# Patient Record
Sex: Male | Born: 1980 | Race: White | Hispanic: No | Marital: Single | Smoking: Current every day smoker
Health system: Southern US, Community
[De-identification: ages and names within clinical notes are randomized; demographics above are authoritative.]

---

## 2013-08-28 ENCOUNTER — Encounter (HOSPITAL_COMMUNITY): Payer: Self-pay | Admitting: Emergency Medicine

## 2013-08-28 ENCOUNTER — Emergency Department (INDEPENDENT_AMBULATORY_CARE_PROVIDER_SITE_OTHER)
Admission: EM | Admit: 2013-08-28 | Discharge: 2013-08-28 | Disposition: A | Discharge status: Home / Self Care | Payer: BC Managed Care – PPO | Source: Home / Self Care | Attending: Emergency Medicine | Admitting: Emergency Medicine

## 2013-08-28 DIAGNOSIS — IMO0002 Reserved for concepts with insufficient information to code with codable children: Secondary | ICD-10-CM

## 2013-08-28 DIAGNOSIS — M5416 Radiculopathy, lumbar region: Secondary | ICD-10-CM

## 2013-08-28 MED ORDER — CYCLOBENZAPRINE HCL 5 MG PO TABS: 5.0000 mg | ORAL_TABLET | Freq: Three times a day (TID) | ORAL | Status: AC | PRN

## 2013-08-28 MED ORDER — KETOROLAC TROMETHAMINE 60 MG/2ML IM SOLN
60.0000 mg | Freq: Once | INTRAMUSCULAR | Status: AC
  Administered 2013-08-28: 60 mg via INTRAMUSCULAR

## 2013-08-28 MED ORDER — METHYLPREDNISOLONE ACETATE 80 MG/ML IJ SUSP
INTRAMUSCULAR | Status: AC
  Filled 2013-08-28: qty 1

## 2013-08-28 MED ORDER — DICLOFENAC SODIUM 75 MG PO TBEC: 75.0000 mg | DELAYED_RELEASE_TABLET | Freq: Two times a day (BID) | ORAL | Status: AC

## 2013-08-28 MED ORDER — METHYLPREDNISOLONE ACETATE 80 MG/ML IJ SUSP
80.0000 mg | Freq: Once | INTRAMUSCULAR | Status: AC
  Administered 2013-08-28: 80 mg via INTRAMUSCULAR

## 2013-08-28 MED ORDER — KETOROLAC TROMETHAMINE 60 MG/2ML IM SOLN
INTRAMUSCULAR | Status: AC
  Filled 2013-08-28: qty 2

## 2013-08-28 MED ORDER — OXYCODONE-ACETAMINOPHEN 5-325 MG PO TABS: ORAL_TABLET | ORAL | Status: AC

## 2013-08-28 NOTE — ED Notes (Signed)
C/o  Lower back pain.  States pulled back while moving furniture on Friday.  Denies any urinary symptoms.  Hx back injury two years ago.  Pt has tried heating pad and otc meds with no relief.

## 2013-08-28 NOTE — ED Provider Notes (Signed)
Chief Complaint   Chief Complaint  Patient presents with  . Back Pain    History of Present Illness   Curtis Ortega is a 33 year old male who moves or so for this past Friday, 5 days ago. Since then he's had pain in his lower back with radiation into the backs of both legs as far as the knees. There is numbness in both legs. It's better if he lies flat on his back or if he applies heat and worse if he walks, stands, or twists. He has a history of a back problem several years ago which was treated with steroid injections and got better after that. This was [place] in [place]. He denies any bladder or bowel dysfunction, extremity weakness, fever, chills, or unintended weight loss.  Review of Systems   Other than as noted above, the patient denies any of the following symptoms: Systemic:  No fever, chills, or unexplained weight loss. GI:  No abdominal painor incontinence of bowel. GU:  No dysuria, frequency, urgency, or hematuria. No incontinence of urine or urinary retention.  M-S:  No neck pain or arthritis. Neuro:  No paresthesias, headache, saddle anesthesia, muscular weakness, or progressive neurological deficit.  PMFSH   Past medical history, family history, social history, meds, and allergies were reviewed. Specifically, there is no history of cancer, major trauma, osteoporosis, immunosuppression, or HIV infection.   Physical Examination    Vital signs:  BP 113/67  Pulse 75  Temp(Src) 98.4 F (36.9 C) (Oral)  Resp 16  SpO2 100% General:  Alert, oriented, in no distress. Abdomen:  Soft, non-tender.  No organomegaly or mass.  No pulsatile midline abdominal mass or bruit. Back:  There is pain to palpation lower lumbar spine but not over the sacroiliac joints. His back had a limited range of motion with 30 of flexion, 10 of extension, 10 of lateral bending, and 30 of rotation with pain. Straight leg raising was markedly positive on the left and newly positive on the right. He has  a positive Lasegue's sign and positive popliteal compression bilaterally. Neuro:  Normal muscle strength, sensations and DTRs. Extremities: Pedal pulses were full, there was no edema. Skin:  Clear, warm and dry.  No rash.  [facility]   Given Depo-Medrol 80 mg IM and Toradol 60 mg IM.    Assessment   The encounter diagnosis was Lumbar radiculopathy.  Plan     1.  Meds:  The following meds were prescribed:   Discharge Medication List as of 08/28/2013 11:09 AM    START taking these medications   Details  cyclobenzaprine (FLEXERIL) 5 MG tablet Take 1 tablet (5 mg total) by mouth 3 (three) times daily as needed for muscle spasms., Starting 08/28/2013, Until Discontinued, Normal    diclofenac (VOLTAREN) 75 MG EC tablet Take 1 tablet (75 mg total) by mouth 2 (two) times daily., Starting 08/28/2013, Until Discontinued, Normal    oxyCODONE-acetaminophen (PERCOCET) 5-325 MG per tablet 1 to 2 tablets every 6 hours as needed for pain., Print        2.  Patient Education/Counseling:  The patient was given appropriate handouts, self care instructions, and instructed in symptomatic relief. The patient was encouraged to try to be as active as possible and given some exercises to do followed by moist heat.  3.  Follow up:  The patient was told to follow up here if no better in 3 to 4 days, or sooner if becoming worse in any way, and given  some red flag symptoms such as worsening pain or new neurological symptoms which would prompt immediate return.  Follow up with Dr. Renaye Rakers if no better in 2-4 weeks.     Reuben Likes, MD 08/28/13 [phone]

## 2013-08-28 NOTE — Discharge Instructions (Signed)
Do exercises twice daily followed by moist heat for 15 minutes. ° ° ° ° ° °Try to be as active as possible. ° °If no better in 2 weeks, follow up with orthopedist. ° ° °

## 2013-12-17 ENCOUNTER — Emergency Department (INDEPENDENT_AMBULATORY_CARE_PROVIDER_SITE_OTHER): Payer: BC Managed Care – PPO

## 2013-12-17 ENCOUNTER — Emergency Department (HOSPITAL_COMMUNITY)
Admission: EM | Admit: 2013-12-17 | Discharge: 2013-12-17 | Disposition: A | Discharge status: Home / Self Care | Payer: BC Managed Care – PPO | Source: Home / Self Care | Attending: Family Medicine | Admitting: Family Medicine

## 2013-12-17 ENCOUNTER — Encounter (HOSPITAL_COMMUNITY): Payer: Self-pay | Admitting: Emergency Medicine

## 2013-12-17 DIAGNOSIS — IMO0002 Reserved for concepts with insufficient information to code with codable children: Secondary | ICD-10-CM

## 2013-12-17 DIAGNOSIS — S9032XA Contusion of left foot, initial encounter: Secondary | ICD-10-CM

## 2013-12-17 DIAGNOSIS — S9030XA Contusion of unspecified foot, initial encounter: Secondary | ICD-10-CM

## 2013-12-17 MED ORDER — IBUPROFEN 800 MG PO TABS
800.0000 mg | ORAL_TABLET | Freq: Once | ORAL | Status: AC
  Administered 2013-12-17: 800 mg via ORAL

## 2013-12-17 MED ORDER — IBUPROFEN 800 MG PO TABS
ORAL_TABLET | ORAL | Status: AC
  Filled 2013-12-17: qty 1

## 2013-12-17 NOTE — ED Provider Notes (Signed)
Medical screening examination/treatment/procedure(s) were performed by resident physician or non-physician practitioner and as supervising physician I was immediately available for consultation/collaboration.   KINDL,JAMES DOUGLAS MD.   James D Kindl, MD 12/17/13 2121 

## 2013-12-17 NOTE — ED Provider Notes (Signed)
CSN: [id number]     Arrival date & time 12/17/13  1526 History   First MD Initiated Contact with Patient 12/17/13 1631     Chief Complaint  Patient presents with  . Foot Injury   (Consider location/radiation/quality/duration/timing/severity/associated sxs/prior Treatment) Patient is a 33 y.o. male presenting with foot injury. The history is provided by the patient.  Foot Injury Location:  Foot Time since incident:  4 days Injury: yes   Mechanism of injury comment:  Works [employer] and accidentally dropped a bedrail on his foot on 12-14-2013 Foot location:  L foot Chronicity:  New Dislocation: no     History reviewed. No pertinent past medical history. History reviewed. No pertinent past surgical history. History reviewed. No pertinent family history. History  Substance Use Topics  . Smoking status: Current Every Day Smoker -- 0.50 packs/day    Types: Cigarettes  . Smokeless tobacco: Not on file  . Alcohol Use: Yes    Review of Systems  All other systems reviewed and are negative.   Allergies  Review of patient's allergies indicates no known allergies.  Home Medications   Prior to Admission medications   Medication Sig Start Date End Date Taking? Authorizing Provider  cyclobenzaprine (FLEXERIL) 5 MG tablet Take 1 tablet (5 mg total) by mouth 3 (three) times daily as needed for muscle spasms. 08/28/13   Reuben Likesavid C Keller, MD  diclofenac (VOLTAREN) 75 MG EC tablet Take 1 tablet (75 mg total) by mouth 2 (two) times daily. 08/28/13   Reuben Likesavid C Keller, MD  oxyCODONE-acetaminophen (PERCOCET) 5-325 MG per tablet 1 to 2 tablets every 6 hours as needed for pain. 08/28/13   Reuben Likesavid C Keller, MD  Sertraline HCl (ZOLOFT PO) Take by mouth.    Historical Provider, MD   BP 130/86  Pulse 80  Temp(Src) 98.8 F (37.1 C) (Oral)  Resp 14  SpO2 100% Physical Exam  Nursing note and vitals reviewed. Constitutional: He is oriented to person, place, and time. He appears  well-developed and well-nourished. No distress.  HENT:  Head: Normocephalic and atraumatic.  Eyes: Conjunctivae are normal. No scleral icterus.  Cardiovascular: Normal rate.   Pulmonary/Chest: Effort normal.  Musculoskeletal:       Left foot: He exhibits tenderness and swelling. He exhibits normal range of motion, normal capillary refill, no crepitus, no deformity and no laceration.       Feet:  +CSM exam of left normal.  Neurological: He is alert and oriented to person, place, and time.  Skin: Skin is warm and dry.  +intact  Psychiatric: He has a normal mood and affect. His behavior is normal.    ED Course  Procedures (including critical care time) Labs Review Labs Reviewed - No data to display  Imaging Review Dg Foot Complete Left  12/17/2013   CLINICAL DATA:  Pain post trauma  EXAM: LEFT FOOT - COMPLETE 3+ VIEW  COMPARISON:  None.  FINDINGS: Frontal, oblique, and lateral views were obtained. There is no fracture or dislocation. Joint spaces appear intact. No erosive change.  IMPRESSION: No abnormality noted.   Electronically Signed   By: Bretta BangWilliam  Woodruff M.D.   On: 12/17/2013 16:59     MDM   1. Contusion of left foot, initial encounter    Films negative for fracture. Will provide ice pack, ibuprofen 800mg  [facility] and advise NSAIDs, RICE therapy and post op shoe as needed for comfort at home. Ortho follow up if no improvement over  the next 2 weeks.   Jess BartersJennifer Lee MercerPresson, [place]PA 12/17/13 1710

## 2013-12-17 NOTE — Discharge Instructions (Signed)
Your xrays were without evidence of fracture. Ice and elevation to reduce pain and swelling. Ibuprofen as directed on packaging for pain. Post op shoe as needed for comfort. If no improvement over the next 2 weeks, please follow up with the orthopedist listed on your discharge paperwork.   Foot Contusion A foot contusion is a deep bruise to the foot. Contusions are the result of an injury that caused bleeding under the skin. The contusion may turn blue, purple, or yellow. Minor injuries will give you a painless contusion, but more severe contusions may stay painful and swollen for a few weeks. CAUSES  A foot contusion comes from a direct blow to that area, such as a heavy object falling on the foot. SYMPTOMS   Swelling of the foot.  Discoloration of the foot.  Tenderness or soreness of the foot. DIAGNOSIS  You will have a physical exam and will be asked about your history. You may need an X-ray of your foot to look for a broken bone (fracture).  TREATMENT  An elastic wrap may be recommended to support your foot. Resting, elevating, and applying cold compresses to your foot are often the best treatments for a foot contusion. Over-the-counter medicines may also be recommended for pain control. HOME CARE INSTRUCTIONS   Put ice on the injured area.  Put ice in a plastic bag.  Place a towel between your skin and the bag.  Leave the ice on for 15-20 minutes, 03-04 times a day.  Only take over-the-counter or prescription medicines for pain, discomfort, or fever as directed by your caregiver.  If told, use an elastic wrap as directed. This can help reduce swelling. You may remove the wrap for sleeping, showering, and bathing. If your toes become numb, cold, or blue, take the wrap off and reapply it more loosely.  Elevate your foot with pillows to reduce swelling.  Try to avoid standing or walking while the foot is painful. Do not resume use until instructed by your caregiver. Then, begin use  gradually. If pain develops, decrease use. Gradually increase activities that do not cause discomfort until you have normal use of your foot.  See your caregiver as directed. It is very important to keep all follow-up appointments in order to avoid any lasting problems with your foot, including long-term (chronic) pain. SEEK IMMEDIATE MEDICAL CARE IF:   You have increased redness, swelling, or pain in your foot.  Your swelling or pain is not relieved with medicines.  You have loss of feeling in your foot or are unable to move your toes.  Your foot turns cold or blue.  You have pain when you move your toes.  Your foot becomes warm to the touch.  Your contusion does not improve in 2 days. MAKE SURE YOU:   Understand these instructions.  Will watch your condition.  Will get help right away if you are not doing well or get worse. Document Released: 03/29/2006 Document Revised: 12/07/2011 Document Reviewed: 05/11/2011 [facility] Patient Information 2015 [employer] [place]. This information is not intended to replace advice given to you by your health care provider. Make sure you discuss any questions you have with your health care provider.

## 2013-12-17 NOTE — ED Notes (Signed)
C/o left foot injury due to dropping a bed rail on foot on Friday

## 2015-01-05 IMAGING — CR DG FOOT COMPLETE 3+V*L*
3 series · 3 of 3 positions shown · non-contrast
Comparison: None.

CLINICAL DATA: Pain post trauma

EXAM:
LEFT FOOT - COMPLETE 3+ VIEW

[view not recorded (1 of 3)]
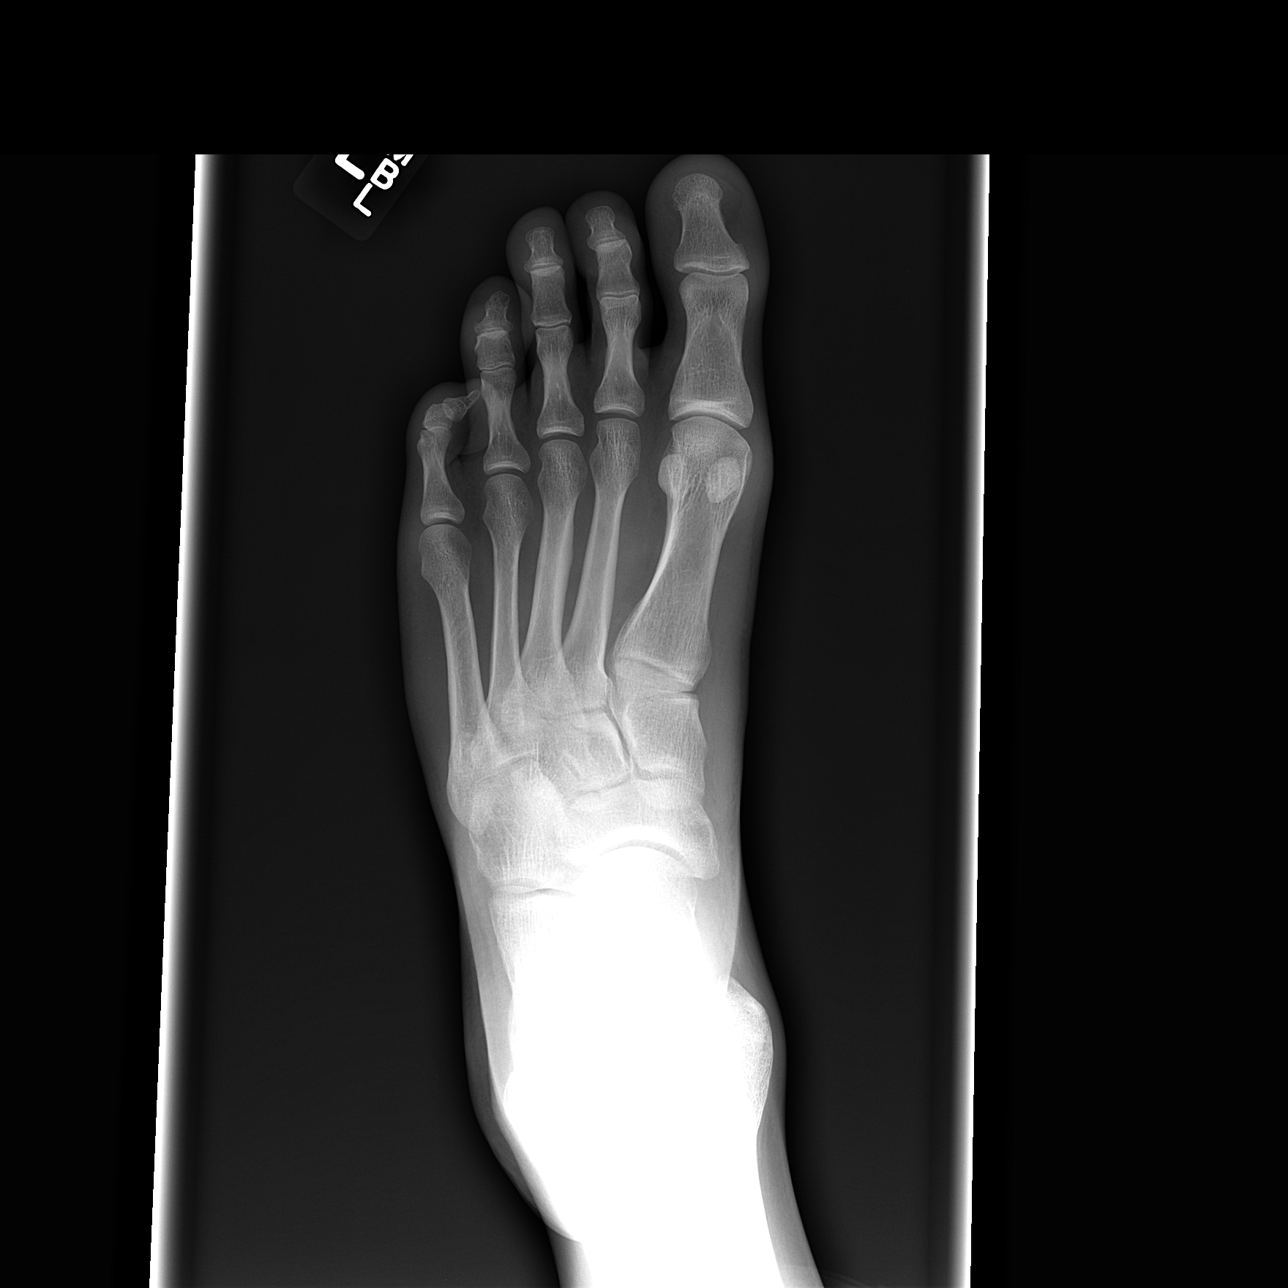

[view not recorded (2 of 3)]
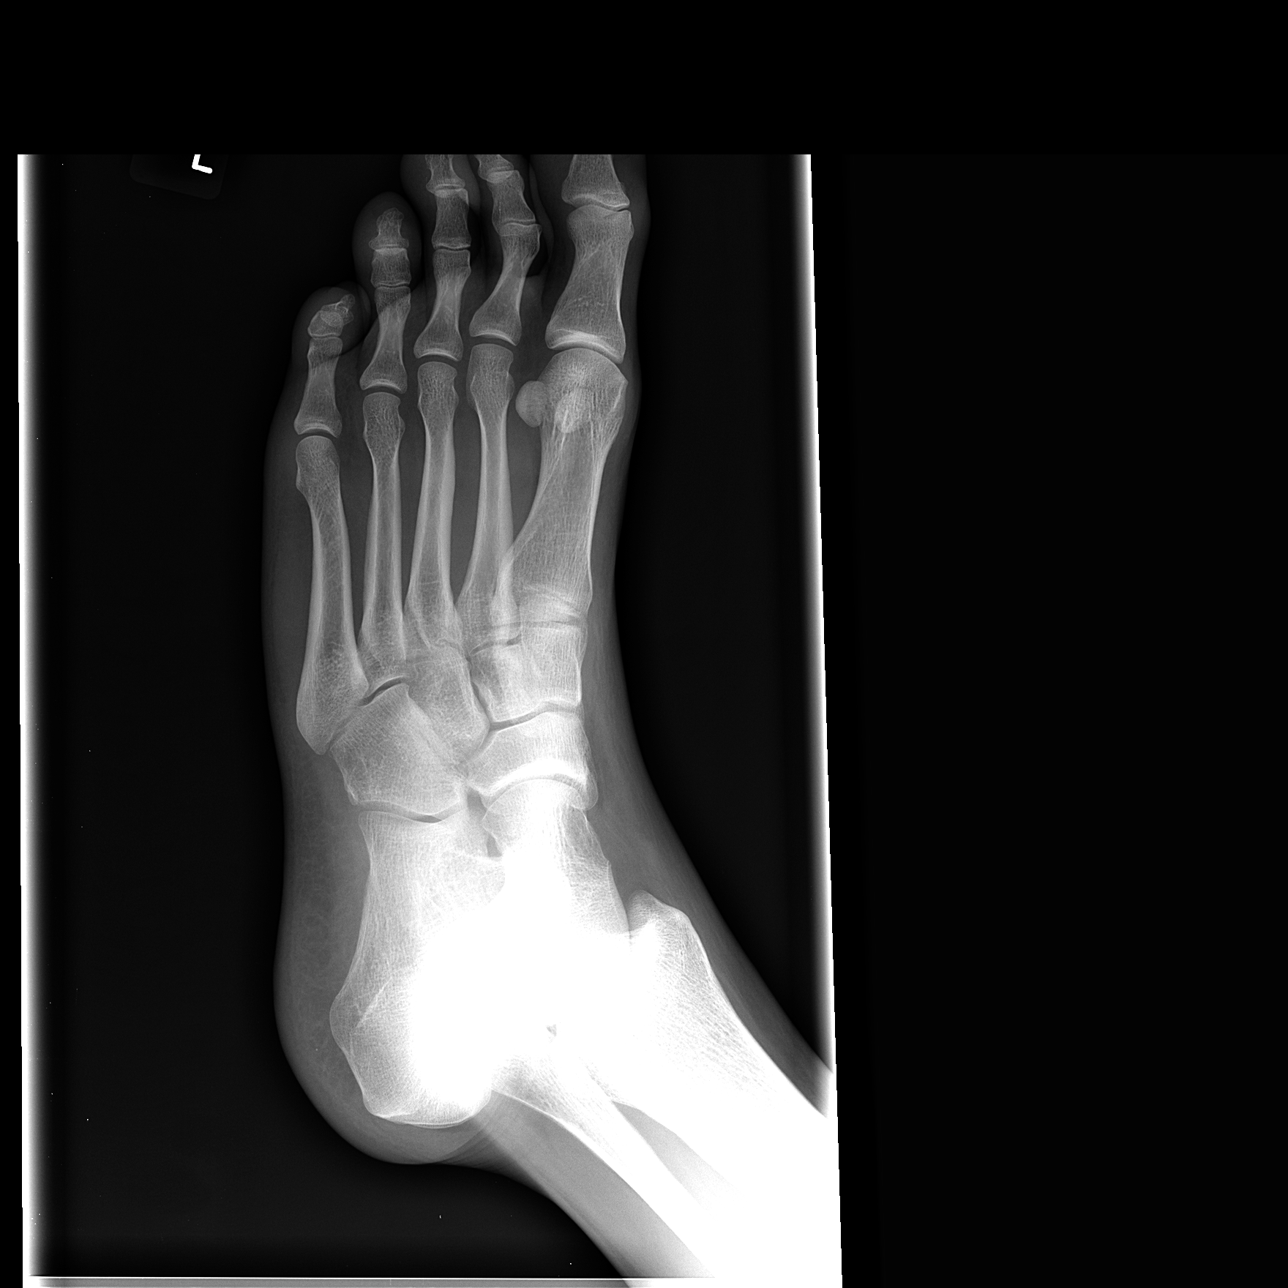

[view not recorded (3 of 3)]
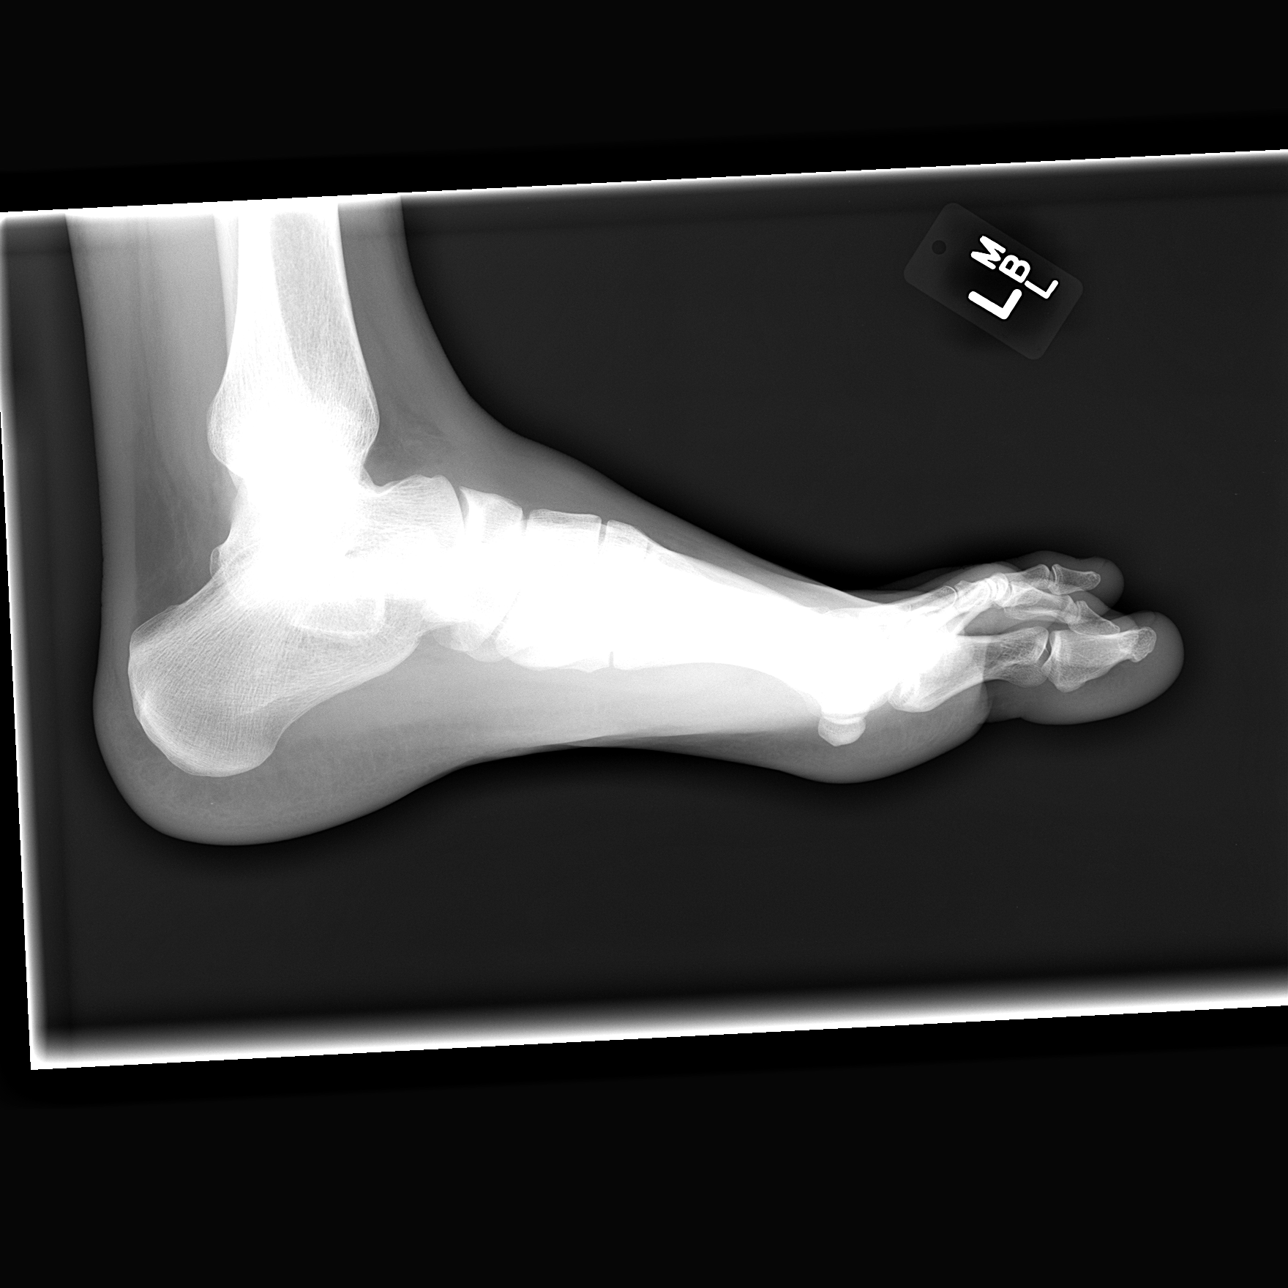

[3 of 3 positions shown; findings below may reference images not displayed]

FINDINGS: Frontal, oblique, and lateral views were obtained. There is no
fracture or dislocation. Joint spaces appear intact. No erosive
change.
IMPRESSION: No abnormality noted.
